# Patient Record
Sex: Female | Born: 1975 | Hispanic: Yes | Marital: Single | Smoking: Current every day smoker
Health system: Southern US, Community
[De-identification: ages and names within clinical notes are randomized; demographics above are authoritative.]

## PROBLEM LIST (undated history)

## (undated) DIAGNOSIS — D649 Anemia, unspecified: Secondary | ICD-10-CM

## (undated) DIAGNOSIS — I499 Cardiac arrhythmia, unspecified: Secondary | ICD-10-CM

## (undated) DIAGNOSIS — J45909 Unspecified asthma, uncomplicated: Secondary | ICD-10-CM

## (undated) HISTORY — PX: BREAST SURGERY: SHX581

## (undated) HISTORY — PX: MASTECTOMY: SHX3

## (undated) HISTORY — PX: ABDOMINAL HYSTERECTOMY: SHX81

---

## 2016-04-15 ENCOUNTER — Emergency Department: Payer: Medicaid Other

## 2016-04-15 ENCOUNTER — Emergency Department
Admission: EM | Admit: 2016-04-15 | Discharge: 2016-04-15 | Disposition: A | Discharge status: Home / Self Care | Payer: Medicaid Other | Attending: Emergency Medicine | Admitting: Emergency Medicine

## 2016-04-15 DIAGNOSIS — D649 Anemia, unspecified: Secondary | ICD-10-CM | POA: Diagnosis not present

## 2016-04-15 DIAGNOSIS — J45901 Unspecified asthma with (acute) exacerbation: Secondary | ICD-10-CM

## 2016-04-15 DIAGNOSIS — R0602 Shortness of breath: Secondary | ICD-10-CM | POA: Diagnosis present

## 2016-04-15 DIAGNOSIS — F17210 Nicotine dependence, cigarettes, uncomplicated: Secondary | ICD-10-CM | POA: Diagnosis not present

## 2016-04-15 DIAGNOSIS — J40 Bronchitis, not specified as acute or chronic: Secondary | ICD-10-CM | POA: Diagnosis not present

## 2016-04-15 HISTORY — DX: Cardiac arrhythmia, unspecified: I49.9

## 2016-04-15 HISTORY — DX: Unspecified asthma, uncomplicated: J45.909

## 2016-04-15 HISTORY — DX: Anemia, unspecified: D64.9

## 2016-04-15 LAB — BASIC METABOLIC PANEL
Anion gap: 5 (ref 5–15)
BUN: 17 mg/dL (ref 6–20)
CALCIUM: 8.8 mg/dL — AB (ref 8.9–10.3)
CHLORIDE: 107 mmol/L (ref 101–111)
CO2: 26 mmol/L (ref 22–32)
CREATININE: 0.68 mg/dL (ref 0.44–1.00)
GFR calc non Af Amer: >60 mL/min (ref >60)
Glucose, Bld: 115 mg/dL — ABNORMAL HIGH (ref 65–99)
Potassium: 3.7 mmol/L (ref 3.5–5.1)
Sodium: 138 mmol/L (ref 135–145)

## 2016-04-15 LAB — CBC WITH DIFFERENTIAL/PLATELET
BASOS PCT: 1 %
Basophils Absolute: 0 10*3/uL (ref 0–0.1)
EOS ABS: 0.3 10*3/uL (ref 0–0.7)
Eosinophils Relative: 5 %
HEMATOCRIT: 27.3 % — AB (ref 35.0–47.0)
Hemoglobin: 8.5 g/dL — ABNORMAL LOW (ref 12.0–16.0)
LYMPHS ABS: 1.4 10*3/uL (ref 1.0–3.6)
Lymphocytes Relative: 28 %
MCH: 20.7 pg — AB (ref 26.0–34.0)
MCHC: 31 g/dL — AB (ref 32.0–36.0)
MCV: 66.8 fL — ABNORMAL LOW (ref 80.0–100.0)
MONO ABS: 0.5 10*3/uL (ref 0.2–0.9)
MONOS PCT: 10 %
Neutro Abs: 2.9 10*3/uL (ref 1.4–6.5)
Neutrophils Relative %: 56 %
Platelets: 401 10*3/uL (ref 150–440)
RBC: 4.09 MIL/uL (ref 3.80–5.20)
RDW: 18.6 % — AB (ref 11.5–14.5)
WBC: 5.2 10*3/uL (ref 3.6–11.0)

## 2016-04-15 LAB — TROPONIN I: Troponin I: <0.03 ng/mL (ref <0.03)

## 2016-04-15 MED ORDER — HYDROCOD POLST-CPM POLST ER 10-8 MG/5ML PO SUER: 5.0000 mL | Freq: Two times a day (BID) | ORAL | 0 refills | Status: AC

## 2016-04-15 MED ORDER — SODIUM CHLORIDE 0.9 % IV BOLUS (SEPSIS)
1000.0000 mL | Freq: Once | INTRAVENOUS | Status: AC
  Administered 2016-04-15: 1000 mL via INTRAVENOUS

## 2016-04-15 MED ORDER — ALBUTEROL SULFATE HFA 108 (90 BASE) MCG/ACT IN AERS: 2.0000 | INHALATION_SPRAY | RESPIRATORY_TRACT | 0 refills | Status: AC | PRN

## 2016-04-15 MED ORDER — IPRATROPIUM-ALBUTEROL 0.5-2.5 (3) MG/3ML IN SOLN
RESPIRATORY_TRACT | Status: AC
  Administered 2016-04-15: 3 mL via RESPIRATORY_TRACT
  Filled 2016-04-15: qty 3

## 2016-04-15 MED ORDER — IOPAMIDOL (ISOVUE-370) INJECTION 76%
75.0000 mL | Freq: Once | INTRAVENOUS | Status: AC | PRN
  Administered 2016-04-15: 75 mL via INTRAVENOUS

## 2016-04-15 MED ORDER — HYDROCOD POLST-CPM POLST ER 10-8 MG/5ML PO SUER
5.0000 mL | Freq: Once | ORAL | Status: AC
  Administered 2016-04-15: 5 mL via ORAL
  Filled 2016-04-15: qty 5

## 2016-04-15 MED ORDER — IPRATROPIUM-ALBUTEROL 0.5-2.5 (3) MG/3ML IN SOLN
3.0000 mL | Freq: Once | RESPIRATORY_TRACT | Status: AC
  Administered 2016-04-15: 3 mL via RESPIRATORY_TRACT

## 2016-04-15 MED ORDER — METHYLPREDNISOLONE SODIUM SUCC 125 MG IJ SOLR
125.0000 mg | Freq: Once | INTRAMUSCULAR | Status: AC
  Administered 2016-04-15: 125 mg via INTRAVENOUS
  Filled 2016-04-15: qty 2

## 2016-04-15 MED ORDER — IPRATROPIUM-ALBUTEROL 0.5-2.5 (3) MG/3ML IN SOLN
RESPIRATORY_TRACT | Status: AC
  Filled 2016-04-15: qty 3

## 2016-04-15 MED ORDER — ALBUTEROL SULFATE (2.5 MG/3ML) 0.083% IN NEBU
5.0000 mg | INHALATION_SOLUTION | Freq: Once | RESPIRATORY_TRACT | Status: AC
  Administered 2016-04-15: 5 mg via RESPIRATORY_TRACT
  Filled 2016-04-15: qty 6

## 2016-04-15 MED ORDER — PREDNISONE 20 MG PO TABS: ORAL_TABLET | ORAL | 0 refills | Status: AC

## 2016-04-15 NOTE — ED Provider Notes (Signed)
I assumed care from Dr. Dolores FrameSung. At about 7:45 AM, I reevaluated the patient. Patient declines interpreter, patient appears to understand English, and significant other also does some translating.  She still has some mild to moderate wheezing in all fields, without any retractions or acute distress. Heart rate at rest around 99. Walking O2 sat 100% on room air. Patient comfortable walking. Heart rate did go up to 115. I discussed with patient and significant other that I think it is okay for her to be discharged with prescription for albuterol and prednisone, but certainly low threshold for returning for evaluation if she has any worsening condition.   Governor Rooksebecca Cammie Faulstich, MD 04/15/16 [phone]

## 2016-04-15 NOTE — ED Notes (Signed)
Patient's family member states patient cannot breathe. Patient is well appearing, no labored breathing. Has congested sounding cough; is a smoker; Just returned from [place] a week ago and left her nebulizer there.

## 2016-04-15 NOTE — ED Provider Notes (Signed)
[facility] Emergency Department Provider Note   ____________________________________________   First MD Initiated Contact with Patient 04/15/16 [phone]     (approximate)  I have reviewed the triage vital signs and the nursing notes.   HISTORY  Chief Complaint Shortness of Breath  Patient declines Spanish interpreter; prefers to have her boyfriend translate  HPI Samantha Higgins is a 40 y.o. female who presents to the ED from home with a chief complaint of shortness of breath, chest pain and congestion. Patient has had 2 recent trips to [place]. Reports shortness of breath 2 weeks ago but did not seek medical evaluation. This week she has started having runny nose and nonproductive cough. States she woke up tonight with chest pain and shortness of breath. Pain is increased with deep inspiration. Denies associated fever, chills, abdominal pain, nausea, vomiting, diarrhea. Nothing makes her symptoms better.   Past Medical History:  Diagnosis Date  . Anemia   . Arrhythmia   . Asthma     There are no active problems to display for this patient.   Past Surgical History:  Procedure Laterality Date  . ABDOMINAL HYSTERECTOMY    . MASTECTOMY     right    Prior to Admission medications   Not on File    Allergies Review of patient's allergies indicates no known allergies.  No family history on file.  Social History Social History  Substance Use Topics  . Smoking status: Current Every Day Smoker    Packs/day: 0.50    Types: Cigarettes  . Smokeless tobacco: Never Used  . Alcohol use Not on file    Review of Systems  Constitutional: No fever/chills. Eyes: No visual changes. ENT: No sore throat. Cardiovascular: Positive for chest pain. Respiratory: Positive for shortness of breath. Gastrointestinal: No abdominal pain.  No nausea, no vomiting.  No diarrhea.  No constipation. Genitourinary: Negative for dysuria. Musculoskeletal: Negative for  back pain. Skin: Negative for rash. Neurological: Negative for headaches, focal weakness or numbness.  10-point ROS otherwise negative.  ____________________________________________   PHYSICAL EXAM:  VITAL SIGNS: ED Triage Vitals  Enc Vitals Group     BP 04/15/16 0112 105/66     Pulse Rate 04/15/16 0112 (!) 101     Resp 04/15/16 0112 (!) 26     Temp 04/15/16 0112 98.5 F (36.9 C)     Temp Source 04/15/16 0112 Oral     SpO2 04/15/16 0112 100 %     Weight --      Height --      Head Circumference --      Peak Flow --      Pain Score 04/15/16 0118 10     Pain Loc --      Pain Edu? --      Excl. in GC? --     Constitutional: Alert and oriented. Well appearing and in mild acute distress. Eyes: Conjunctivae are normal. PERRL. EOMI. Head: Atraumatic. Nose: No congestion/rhinnorhea. Mouth/Throat: Mucous membranes are moist.  Oropharynx non-erythematous.  Hoarse voice. There is no muffled voice. There is no drooling. Neck: No stridor.   Cardiovascular: Normal rate, regular rhythm. Grossly normal heart sounds.  Good peripheral circulation. Respiratory: Increased respiratory effort.  No retractions. Lungs with mild diffuse wheezing. Gastrointestinal: Soft and nontender. No distention. No abdominal bruits. No CVA tenderness. Musculoskeletal: No lower extremity tenderness nor edema.  No calf tenderness. No joint effusions. Neurologic:  Normal speech and language. No gross focal neurologic deficits are appreciated.  Skin:  Skin is warm, dry and intact. No rash noted. Psychiatric: Mood and affect are normal. Speech and behavior are normal.  ____________________________________________   LABS (all labs ordered are listed, but only abnormal results are displayed)  Labs Reviewed  CBC WITH DIFFERENTIAL/PLATELET - Abnormal; Notable for the following:       Result Value   Hemoglobin 8.5 (*)    HCT 27.3 (*)    MCV 66.8 (*)    MCH 20.7 (*)    MCHC 31.0 (*)    RDW 18.6 (*)    All  other components within normal limits  BASIC METABOLIC PANEL - Abnormal; Notable for the following:    Glucose, Bld 115 (*)    Calcium 8.8 (*)    All other components within normal limits  TROPONIN I   ____________________________________________  EKG  ED ECG REPORT I, SUNG,JADE J, the attending physician, personally viewed and interpreted this ECG.   Date: 04/15/2016  EKG Time: 0117  Rate: 95  Rhythm: normal EKG, normal sinus rhythm  Axis: Normal  Intervals:none  ST&T Change: Nonspecific  ____________________________________________  RADIOLOGY  Chest 2 view (viewed by me, interpreted per Dr. Cherly Hensen): No acute cardiopulmonary process seen. ____________________________________________   PROCEDURES  Procedure(s) performed: None  Procedures  Critical Care performed: No  ____________________________________________   INITIAL IMPRESSION / ASSESSMENT AND PLAN / ED COURSE  Pertinent labs & imaging results that were available during my care of the patient were reviewed by me and considered in my medical decision making (see chart for details).  40 year old female who presents with shortness of breath, chest pain, diffuse wheezing on exam. Symptoms are worse with deep inspiration. In light of patient's recent trips to [place] will obtain screening lab work and CT chest to evaluate for pulmonary embolism.  Clinical Course  Comment By Time  Updated patient and spouse of laboratory results. Patient reports she had blood transfusion in [place] for hemoglobin of 6. Unknown etiology of anemia. Denies bloody stools. Wheezing persists. Will administer second DuoNeb. CT scan pending. Care transferred to Dr. Shaune Pollack pending reassessment and results of CT scan. Irean Hong, MD 09/04 [phone]     ____________________________________________   FINAL CLINICAL IMPRESSION(S) / ED DIAGNOSES  Final diagnoses:  Anemia, unspecified anemia type  Bronchitis      NEW MEDICATIONS  STARTED DURING THIS VISIT:  New Prescriptions   No medications on file     Note:  This document was prepared using Dragon voice recognition software and may include unintentional dictation errors.    Irean Hong, MD 04/15/16 [phone]

## 2016-04-15 NOTE — ED Notes (Signed)
Pt resting in bed, boyfriend at bedside, pt given duoneb, pt states generalized not feeling well

## 2016-04-15 NOTE — ED Triage Notes (Addendum)
Pt reports to ED w/ c/o SOB w/ boyfriend who she requests to be used as interpreter.  Pt sts that she woke up tonight w/ SOB.  Pt has runny nose and nonproductive cough.  Pt c/o CP and rib pain that incr w/ inhalation.  Pt reports having similar episode of SOB last week that resolved w/o being seen.  Pts resp labored, incr SOB w/ speaking, voice hoarse.

## 2016-04-15 NOTE — Discharge Instructions (Addendum)
1. Take prednisone 60 mg daily 4 days. 2. You may take cough medicine as needed (Tussionex). 3. Use albuterol inhaler 2 puffs every 4 hours as needed for wheezing. 4. Start daily over-the-counter iron tablets. 5. Return to the ER for worsening symptoms, including persistent vomiting, difficulty breathing, dizziness or passing out or any other symptoms concerning to you.

## 2016-04-15 NOTE — ED Notes (Signed)
Pt ambulated in hall, pulse ox remained 100% on RA, HR 110-120, pt states mild SOB and discomfort, Dr. Shaune PollackLord notified

## 2016-05-05 ENCOUNTER — Encounter: Payer: Self-pay | Admitting: Emergency Medicine

## 2016-05-05 ENCOUNTER — Emergency Department
Admission: EM | Admit: 2016-05-05 | Discharge: 2016-05-05 | Disposition: A | Discharge status: Home / Self Care | Payer: Medicaid Other | Attending: Emergency Medicine | Admitting: Emergency Medicine

## 2016-05-05 DIAGNOSIS — Z79899 Other long term (current) drug therapy: Secondary | ICD-10-CM | POA: Diagnosis not present

## 2016-05-05 DIAGNOSIS — F17210 Nicotine dependence, cigarettes, uncomplicated: Secondary | ICD-10-CM | POA: Insufficient documentation

## 2016-05-05 DIAGNOSIS — R55 Syncope and collapse: Secondary | ICD-10-CM | POA: Insufficient documentation

## 2016-05-05 DIAGNOSIS — J45909 Unspecified asthma, uncomplicated: Secondary | ICD-10-CM | POA: Diagnosis not present

## 2016-05-05 DIAGNOSIS — Z5321 Procedure and treatment not carried out due to patient leaving prior to being seen by health care provider: Secondary | ICD-10-CM | POA: Insufficient documentation

## 2016-05-05 LAB — BASIC METABOLIC PANEL
ANION GAP: 4 — AB (ref 5–15)
BUN: 13 mg/dL (ref 6–20)
CALCIUM: 8.7 mg/dL — AB (ref 8.9–10.3)
CO2: 26 mmol/L (ref 22–32)
Chloride: 108 mmol/L (ref 101–111)
Creatinine, Ser: 0.94 mg/dL (ref 0.44–1.00)
GFR calc Af Amer: >60 mL/min (ref >60)
GFR calc non Af Amer: >60 mL/min (ref >60)
GLUCOSE: 87 mg/dL (ref 65–99)
Potassium: 3.7 mmol/L (ref 3.5–5.1)
Sodium: 138 mmol/L (ref 135–145)

## 2016-05-05 LAB — URINALYSIS COMPLETE WITH MICROSCOPIC (ARMC ONLY)
BACTERIA UA: NONE SEEN
Bilirubin Urine: NEGATIVE
GLUCOSE, UA: NEGATIVE mg/dL
KETONES UR: NEGATIVE mg/dL
Leukocytes, UA: NEGATIVE
Nitrite: NEGATIVE
PROTEIN: NEGATIVE mg/dL
Specific Gravity, Urine: 1.024 (ref 1.005–1.030)
pH: 7 (ref 5.0–8.0)

## 2016-05-05 LAB — CBC
HCT: 28.3 % — ABNORMAL LOW (ref 35.0–47.0)
Hemoglobin: 9.1 g/dL — ABNORMAL LOW (ref 12.0–16.0)
MCH: 20.8 pg — ABNORMAL LOW (ref 26.0–34.0)
MCHC: 32.2 g/dL (ref 32.0–36.0)
MCV: 64.8 fL — ABNORMAL LOW (ref 80.0–100.0)
Platelets: 360 K/uL (ref 150–440)
RBC: 4.37 MIL/uL (ref 3.80–5.20)
RDW: 19.6 % — ABNORMAL HIGH (ref 11.5–14.5)
WBC: 6.2 K/uL (ref 3.6–11.0)

## 2016-05-05 LAB — GLUCOSE, CAPILLARY: GLUCOSE-CAPILLARY: 87 mg/dL (ref 65–99)
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
## 2016-09-13 ENCOUNTER — Emergency Department
Admission: EM | Admit: 2016-09-13 | Discharge: 2016-09-13 | Disposition: A | Discharge status: Home / Self Care | Payer: Medicaid Other | Attending: Emergency Medicine | Admitting: Emergency Medicine

## 2016-09-13 ENCOUNTER — Encounter: Payer: Self-pay | Admitting: Emergency Medicine

## 2016-09-13 ENCOUNTER — Emergency Department: Payer: Medicaid Other

## 2016-09-13 DIAGNOSIS — J45909 Unspecified asthma, uncomplicated: Secondary | ICD-10-CM | POA: Insufficient documentation

## 2016-09-13 DIAGNOSIS — D649 Anemia, unspecified: Secondary | ICD-10-CM | POA: Insufficient documentation

## 2016-09-13 DIAGNOSIS — R531 Weakness: Secondary | ICD-10-CM | POA: Diagnosis present

## 2016-09-13 DIAGNOSIS — F17210 Nicotine dependence, cigarettes, uncomplicated: Secondary | ICD-10-CM | POA: Insufficient documentation

## 2016-09-13 LAB — URINALYSIS, COMPLETE (UACMP) WITH MICROSCOPIC
BACTERIA UA: NONE SEEN
Bilirubin Urine: NEGATIVE
GLUCOSE, UA: NEGATIVE mg/dL
HGB URINE DIPSTICK: NEGATIVE
Ketones, ur: 5 mg/dL — AB
LEUKOCYTES UA: NEGATIVE
NITRITE: NEGATIVE
PROTEIN: NEGATIVE mg/dL
RBC / HPF: NONE SEEN RBC/hpf (ref 0–5)
Specific Gravity, Urine: 1.023 (ref 1.005–1.030)
pH: 7 (ref 5.0–8.0)

## 2016-09-13 LAB — BASIC METABOLIC PANEL
ANION GAP: 6 (ref 5–15)
BUN: 13 mg/dL (ref 6–20)
CO2: 23 mmol/L (ref 22–32)
Calcium: 8.8 mg/dL — ABNORMAL LOW (ref 8.9–10.3)
Chloride: 108 mmol/L (ref 101–111)
Creatinine, Ser: 0.76 mg/dL (ref 0.44–1.00)
Glucose, Bld: 105 mg/dL — ABNORMAL HIGH (ref 65–99)
POTASSIUM: 3.8 mmol/L (ref 3.5–5.1)
Sodium: 137 mmol/L (ref 135–145)

## 2016-09-13 LAB — CBC
HEMATOCRIT: 32.3 % — AB (ref 35.0–47.0)
HEMOGLOBIN: 10.3 g/dL — AB (ref 12.0–16.0)
MCH: 21.6 pg — ABNORMAL LOW (ref 26.0–34.0)
MCHC: 31.9 g/dL — ABNORMAL LOW (ref 32.0–36.0)
MCV: 67.7 fL — ABNORMAL LOW (ref 80.0–100.0)
Platelets: 324 10*3/uL (ref 150–440)
RBC: 4.78 MIL/uL (ref 3.80–5.20)
RDW: 19.8 % — ABNORMAL HIGH (ref 11.5–14.5)
WBC: 4.9 10*3/uL (ref 3.6–11.0)

## 2016-09-13 LAB — PREGNANCY, URINE: PREG TEST UR: NEGATIVE

## 2016-09-13 LAB — FIBRIN DERIVATIVES D-DIMER (ARMC ONLY): Fibrin derivatives D-dimer (ARMC): 222 (ref 0–499)

## 2016-09-13 MED ORDER — FERROUS SULFATE 325 (65 FE) MG PO TABS: 325.0000 mg | ORAL_TABLET | Freq: Every day | ORAL | 3 refills | Status: AC

## 2016-09-13 NOTE — ED Notes (Signed)
Interpreter paged for patient 

## 2016-09-13 NOTE — ED Provider Notes (Signed)
Time Seen: Approximately 0915  I have reviewed the triage notes  Chief Complaint: Breast Pain and Weakness   History of Present Illness: Samantha Higgins is a 41 y.o. female *who is exclusively Spanish-speaking and history and review of systems along with discharge discussion was performed through [place] translation. Interpreter at the bedside. The patient has a couple of separate complaints. She states she's had a several month history of generalized weakness without any syncope or focal weakness in either upper or lower extremities. She states she felt that she was anemic and has been on iron in the past. He does not currently have any local primary physician and is requesting referral. Patient's secondary complaint is some pain on the outside surface of her right breast. She's had a history of what sounds like fibrocystic breast disease. She has not ever received chemotherapy or radiation therapy and had a biopsy performed approximately 14 years ago. She states she was on a medication for 6 months and then it was stopped. She is not sure the name of the medication. She denies any shortness of breath, pleuritic pain, fever. Patient states that she has felt some swelling and tenderness over the right lateral surface of the breast without any nipple discharge or drainage   Past Medical History:  Diagnosis Date  . Anemia   . Arrhythmia   . Asthma     There are no active problems to display for this patient.   Past Surgical History:  Procedure Laterality Date  . ABDOMINAL HYSTERECTOMY    . BREAST SURGERY    . MASTECTOMY     right    Past Surgical History:  Procedure Laterality Date  . ABDOMINAL HYSTERECTOMY    . BREAST SURGERY    . MASTECTOMY     right    Current Outpatient Rx  . Order #: [id number] Class: Print  . Order #: [id number] Class: Print  . Order #: [id number] Class: Print  . Order #: [id number] Class: Print    Allergies:  Patient has no known allergies.  Family  History: Family History  Problem Relation Age of Onset  . Cancer Mother   . Cancer Father   . Cancer Sister     Social History: Social History  Substance Use Topics  . Smoking status: Current Every Day Smoker    Packs/day: 0.50    Types: Cigarettes  . Smokeless tobacco: Never Used  . Alcohol use Yes     Comment: social     Review of Systems:   10 point review of systems was performed and was otherwise negative:  Constitutional: No fever Eyes: No visual disturbances ENT: No sore throat, ear pain Cardiac: No Left-sided chest pain Respiratory: No shortness of breath, wheezing, or stridor Abdomen: No abdominal pain, no vomiting, No diarrhea Endocrine: No weight loss, No night sweats Extremities: No peripheral edema, cyanosis Skin: No rashes, easy bruising Neurologic: No focal weakness, trouble with speech or swollowing Urologic: No dysuria, Hematuria, or urinary frequency   Physical Exam:  ED Triage Vitals  Enc Vitals Group     BP 09/13/16 0853 118/76     Pulse Rate 09/13/16 0853 85     Resp 09/13/16 0853 20     Temp 09/13/16 0853 98.1 F (36.7 C)     Temp Source 09/13/16 0853 Oral     SpO2 09/13/16 0853 100 %     Weight 09/13/16 0853 138 lb (62.6 kg)     Height 09/13/16 0853 5\' 3"  (1.6 m)  Head Circumference --      Peak Flow --      Pain Score 09/13/16 0857 8     Pain Loc --      Pain Edu? --      Excl. in GC? --     General: Awake , Alert , and Oriented times 3; GCS 15 Head: Normal cephalic , atraumatic Eyes: Pupils equal , round, reactive to light Nose/Throat: No nasal drainage, patent upper airway without erythema or exudate.  Neck: Supple, Full range of motion, No anterior adenopathy or palpable thyroid masses Lungs: Clear to ascultation without wheezes , rhonchi, or rales Heart: Regular rate, regular rhythm without murmurs , gallops , or rubs Abdomen: Soft, non tender without rebound, guarding , or rigidity; bowel sounds positive and symmetric in  all 4 quadrants. No organomegaly .        Extremities: 2 plus symmetric pulses. No edema, clubbing or cyanosis Neurologic: normal ambulation, Motor symmetric without deficits, sensory intact Skin: warm, dry, no rashes Examination the right breast with chaperone present shows some tenderness at 9:00 on the breast with a small palpable mass. It is not mobile. There is no significant lymphadenopathy with palpation under the axillary area  Labs:   All laboratory work was reviewed including any pertinent negatives or positives listed below:  Labs Reviewed  BASIC METABOLIC PANEL - Abnormal; Notable for the following:       Result Value   Glucose, Bld 105 (*)    Calcium 8.8 (*)    All other components within normal limits  CBC - Abnormal; Notable for the following:    Hemoglobin 10.3 (*)    HCT 32.3 (*)    MCV 67.7 (*)    MCH 21.6 (*)    MCHC 31.9 (*)    RDW 19.8 (*)    All other components within normal limits  URINALYSIS, COMPLETE (UACMP) WITH MICROSCOPIC - Abnormal; Notable for the following:    Color, Urine YELLOW (*)    APPearance CLEAR (*)    Ketones, ur 5 (*)    Squamous Epithelial / LPF 0-5 (*)    All other components within normal limits  PREGNANCY, URINE  FIBRIN DERIVATIVES D-DIMER ([facility] ONLY)    EKG:  ED ECG REPORT I, Jennye Moccasin, the attending physician, personally viewed and interpreted this ECG.  Date: 09/13/2016 EKG Time: 0852 Rate: *85 Rhythm: normal sinus rhythm QRS Axis: normal Intervals: normal ST/T Wave abnormalities: normal Conduction Disturbances: none Narrative Interpretation: unremarkable Otherwise normal EKG  Radiology: *  "Dg Chest 2 View  Result Date: 09/13/2016 CLINICAL DATA:  Right breast tenderness. EXAM: CHEST  2 VIEW COMPARISON:  CT 04/15/2016.  Chest x-ray 04/14/2016. FINDINGS: Mediastinum hilar structures normal. Heart size stable. No pulmonary venous congestion. No focal infiltrate. No pleural effusion or pneumothorax. Tiny metallic  linear densities again noted projected over the left upper quadrant. No acute bony abnormality. IMPRESSION: No acute cardiopulmonary disease . Electronically Signed   By: Maisie Fus  Register   On: 09/13/2016 10:02  "    I personally reviewed the radiologic studies  ED Course: * Patient's stay here was uneventful and she was referred to the breast center next below for further outpatient follow-up. She will require mammogram and/or ultrasound evaluation, etc. The patient is slightly anemic and be started back on iron supplement therapy. She does not show any signs of focal weakness in his appetite ambulatory without difficulty.     Assessment:  Iron deficiency anemia Right breast mass  likely fibrocystic breast disease*   Final Clinical Impression:   Final diagnoses:  Anemia, unspecified type     Plan:  Outpatient Patient was advised to return immediately if condition worsens. Patient was advised to follow up with their primary care physician or other specialized physicians involved in their outpatient care. The patient and/or family member/power of attorney had laboratory results reviewed at the bedside. All questions and concerns were addressed and appropriate discharge instructions were distributed by the nursing staff.             Jennye Moccasin, MD 09/13/16 1420

## 2016-09-13 NOTE — ED Notes (Signed)
Interpreter request submitted. 

## 2016-09-13 NOTE — ED Triage Notes (Signed)
Pt states she has had tenderness to right breast for last two months. Pt states 14 years ago she had similar symptoms and had a breast biopsy with tissue removed. Pt also states she is feeling tired, and weak. Pt has history of anemia.

## 2016-09-13 NOTE — ED Notes (Signed)
Patient transported to X-ray 

## 2017-08-16 IMAGING — CR DG CHEST 2V
2 series · 2 of 2 positions shown · non-contrast
Comparison: CT 04/15/2016.  Chest x-ray 04/14/2016.

CLINICAL DATA: Right breast tenderness.

EXAM:
CHEST  2 VIEW

[chest pa]
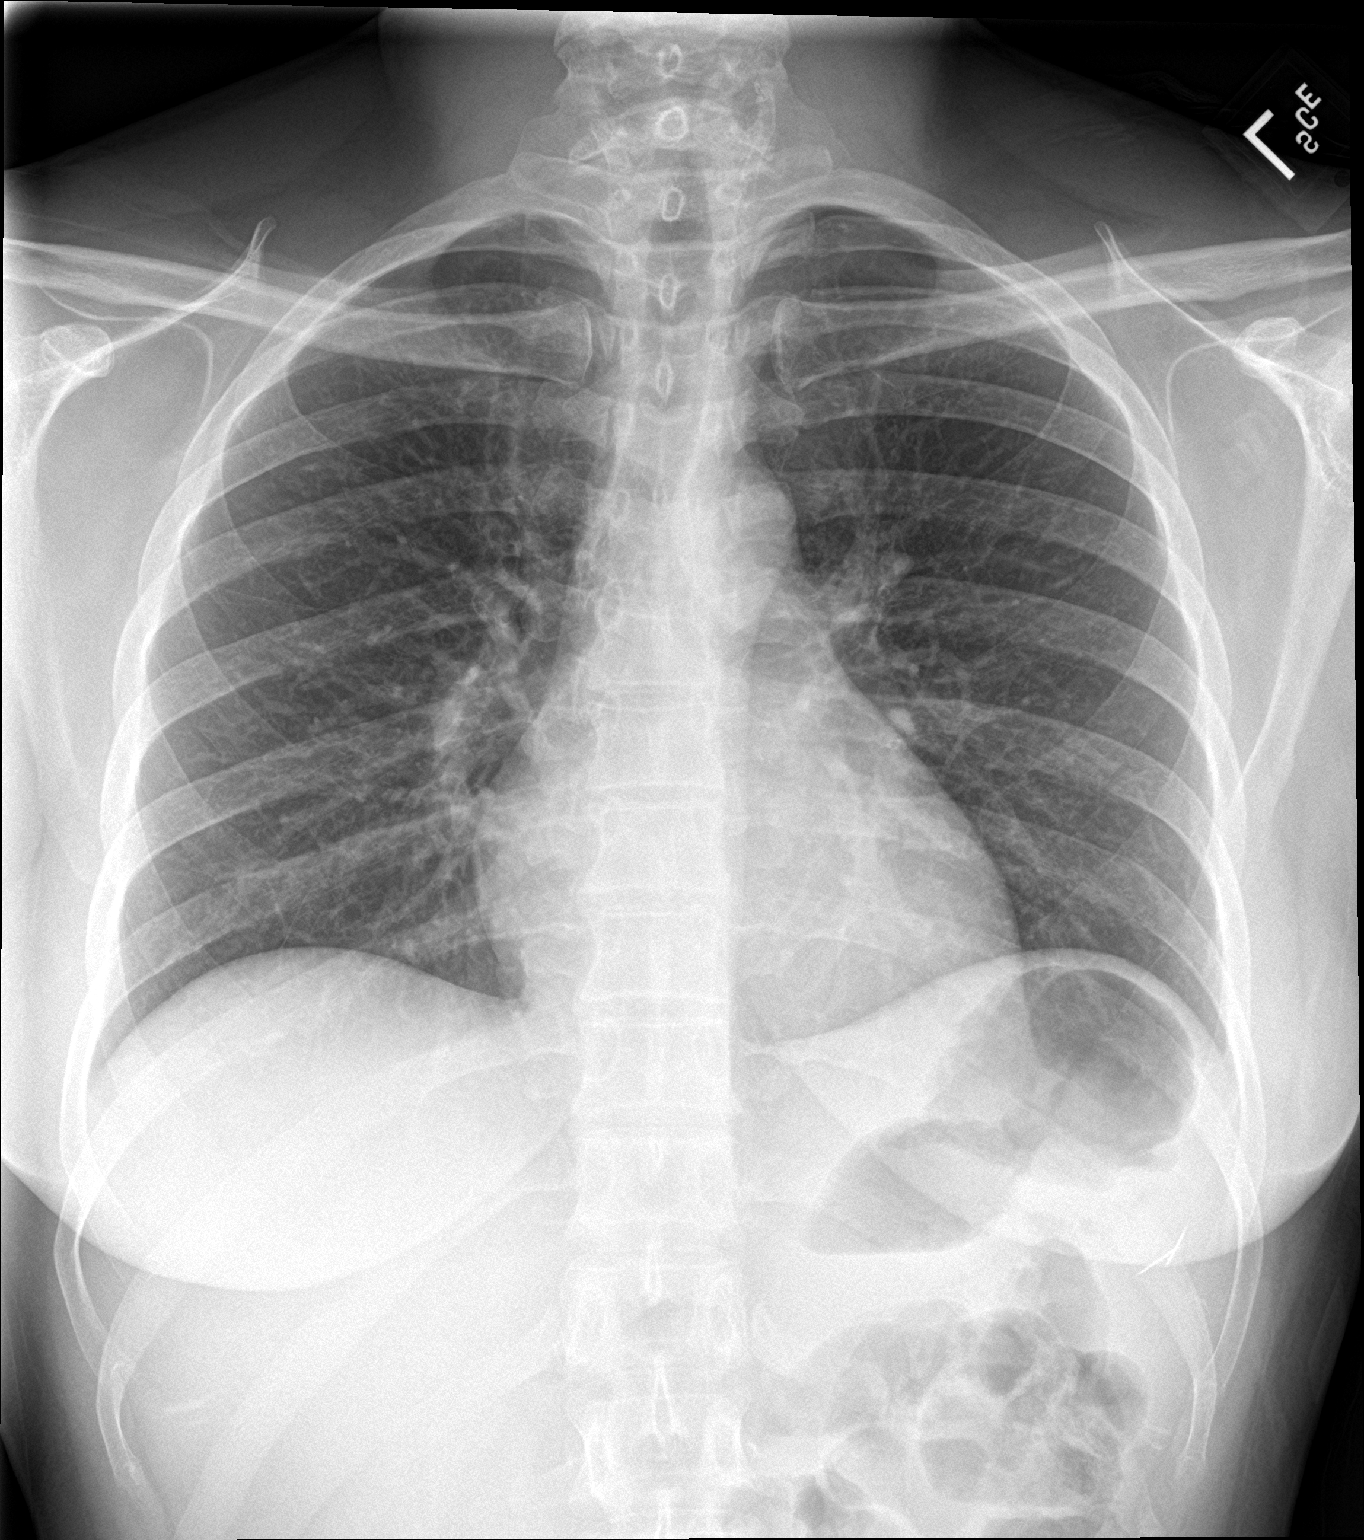

[chest lat]
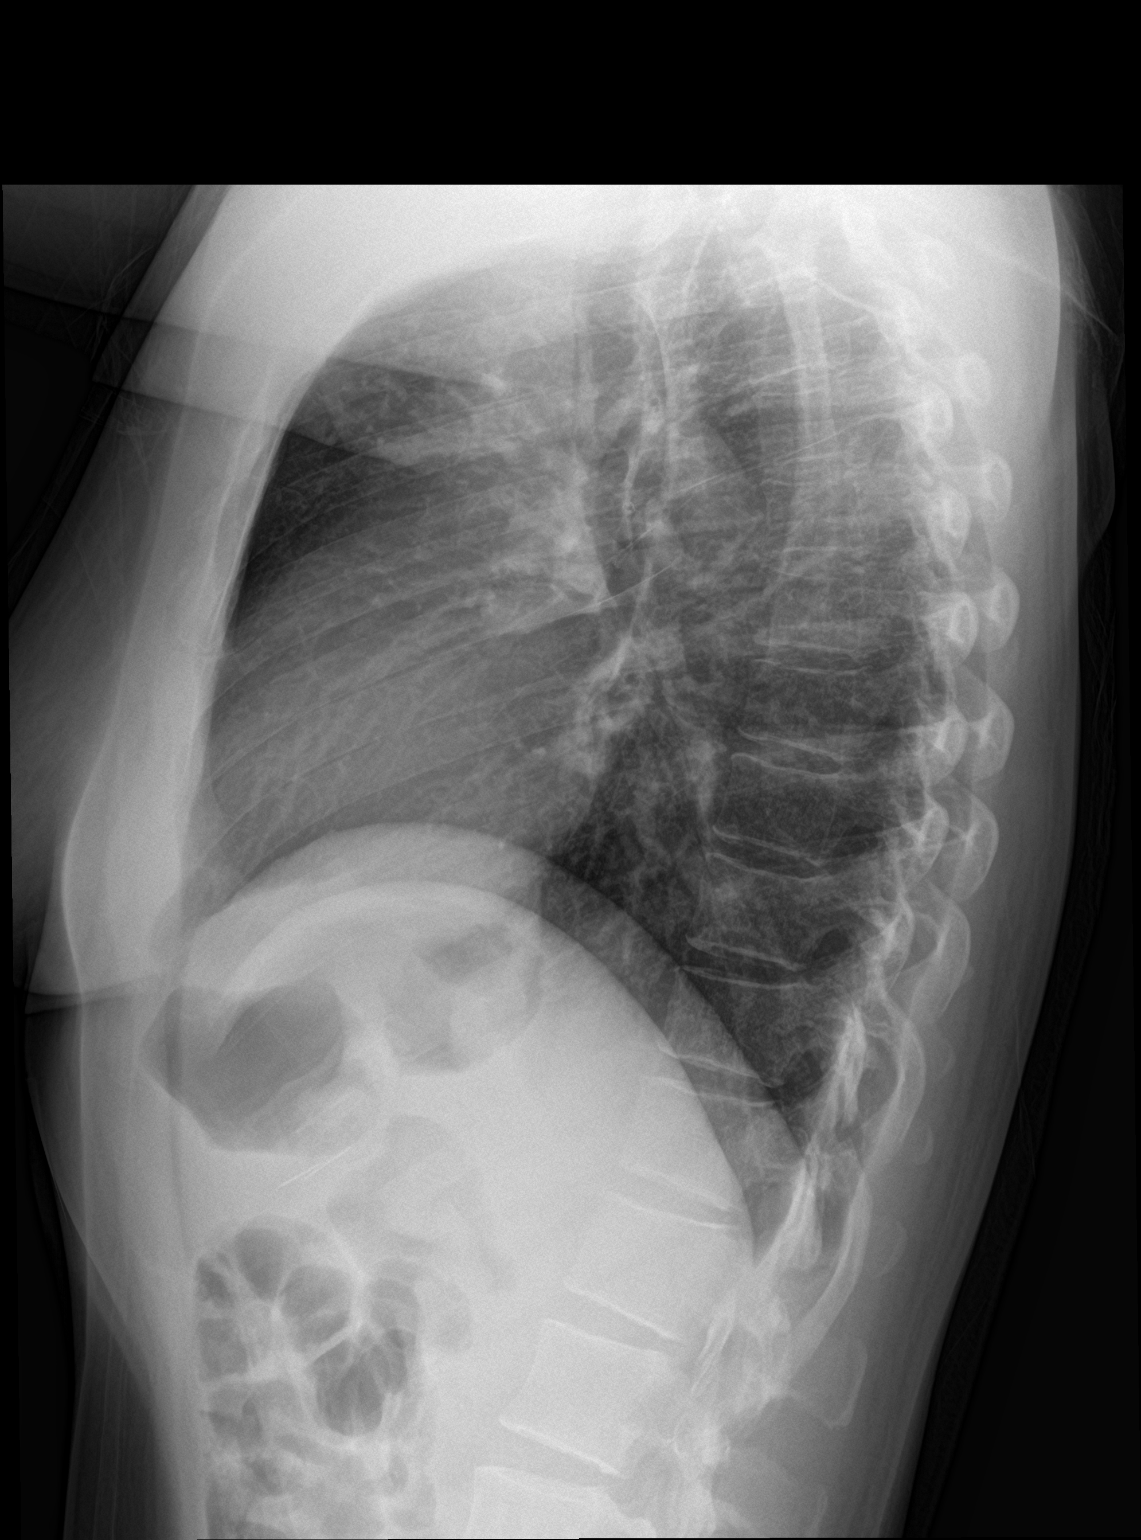

[2 of 2 positions shown; findings below may reference images not displayed]

FINDINGS: Mediastinum hilar structures normal. Heart size stable. No pulmonary
venous congestion. No focal infiltrate. No pleural effusion or
pneumothorax. Tiny metallic linear densities again noted projected
over the left upper quadrant. No acute bony abnormality.
IMPRESSION: No acute cardiopulmonary disease .
